# Patient Record
Sex: Female | Born: 1995 | Race: Black or African American | Hispanic: No | Marital: Single | Smoking: Never smoker
Health system: Southern US, Community
[De-identification: ages and names within clinical notes are randomized; demographics above are authoritative.]

## PROBLEM LIST (undated history)

## (undated) DIAGNOSIS — A6000 Herpesviral infection of urogenital system, unspecified: Secondary | ICD-10-CM

## (undated) DIAGNOSIS — J45909 Unspecified asthma, uncomplicated: Secondary | ICD-10-CM

---
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
## 2018-01-10 ENCOUNTER — Other Ambulatory Visit: Payer: Self-pay

## 2018-01-10 ENCOUNTER — Emergency Department (HOSPITAL_COMMUNITY): Payer: 59

## 2018-01-10 ENCOUNTER — Encounter (HOSPITAL_COMMUNITY): Payer: Self-pay | Admitting: Emergency Medicine

## 2018-01-10 ENCOUNTER — Emergency Department (HOSPITAL_COMMUNITY)
Admission: EM | Admit: 2018-01-10 | Discharge: 2018-01-10 | Disposition: A | Discharge status: Home / Self Care | Payer: 59 | Attending: Emergency Medicine | Admitting: Emergency Medicine

## 2018-01-10 DIAGNOSIS — R030 Elevated blood-pressure reading, without diagnosis of hypertension: Secondary | ICD-10-CM | POA: Insufficient documentation

## 2018-01-10 DIAGNOSIS — J45909 Unspecified asthma, uncomplicated: Secondary | ICD-10-CM | POA: Insufficient documentation

## 2018-01-10 DIAGNOSIS — J069 Acute upper respiratory infection, unspecified: Secondary | ICD-10-CM | POA: Diagnosis not present

## 2018-01-10 DIAGNOSIS — N912 Amenorrhea, unspecified: Secondary | ICD-10-CM | POA: Diagnosis not present

## 2018-01-10 DIAGNOSIS — R0981 Nasal congestion: Secondary | ICD-10-CM

## 2018-01-10 LAB — POC URINE PREG, ED: Preg Test, Ur: NEGATIVE

## 2018-01-10 MED ORDER — AMOXICILLIN-POT CLAVULANATE 875-125 MG PO TABS: 1.0000 | ORAL_TABLET | Freq: Two times a day (BID) | ORAL | 0 refills | Status: AC

## 2018-01-10 MED ORDER — CETIRIZINE HCL 10 MG PO TABS: 10.0000 mg | ORAL_TABLET | Freq: Every day | ORAL | 0 refills | Status: DC

## 2018-01-10 MED ORDER — DM-GUAIFENESIN ER 30-600 MG PO TB12: 1.0000 | ORAL_TABLET | Freq: Two times a day (BID) | ORAL | 0 refills | Status: AC

## 2018-01-10 MED ORDER — FLUTICASONE PROPIONATE 50 MCG/ACT NA SUSP: 1.0000 | Freq: Every day | NASAL | 2 refills | Status: DC

## 2018-01-10 NOTE — Discharge Instructions (Signed)
Please read and follow all provided instructions.  Your diagnoses today include:  1. Nasal congestion   2. Upper respiratory tract infection, unspecified type   3. Elevated blood pressure reading without diagnosis of hypertension     You appear to have an upper respiratory infection (URI). An upper respiratory tract infection, or cold, is a viral infection of the air passages leading to the lungs. It should improve gradually after 5-7 days. You may have a lingering cough that lasts for 2- 4 weeks after the infection.  Tests performed today include: Vital signs. See below for your results today.   Medications prescribed:   Take any prescribed medications only as directed. Treatment for your infection is aimed at treating the symptoms. There are no medications, such as antibiotics, that will cure your infection.   Please take Zyrtec, 10 mg daily at night.  Flonase, 1 spray in each nostril daily in the morning.  Please only take antibiotics if you are worsening or not improving with your nasal congestion in the next 3 days. Please take all of your antibiotics until finished.   You may develop abdominal discomfort or nausea from the antibiotic. If this occurs, you may take it with food. Some patients also get diarrhea with antibiotics. You may help offset this with probiotics which you can buy or get in yogurt. Do not eat or take the probiotics until 2 hours after your antibiotic. Some women develop vaginal yeast infections after antibiotics. If you develop unusual vaginal discharge after being on this medication, please see your primary care provider.   Some people develop allergies to antibiotics. Symptoms of antibiotic allergy can be mild and include a flat rash and itching. They can also be more serious and include:  ?Hives - Hives are raised, red patches of skin that are usually very itchy.  ?Lip or tongue swelling  ?Trouble swallowing or breathing  ?Blistering of the skin or  mouth.  If you have any of these serious symptoms, please seek emergency medical care immediately.   Home care instructions:  Follow any educational materials contained in this packet.   Your illness is contagious and can be spread to others, especially during the first 3 or 4 days. It cannot be cured by antibiotics or other medicines. Take basic precautions such as washing your hands often, covering your mouth when you cough or sneeze, and avoiding public places where you could spread your illness to others.   Please continue drinking plenty of fluids.  Use over-the-counter medicines as needed as directed on packaging for symptom relief.  You may also use ibuprofen or tylenol as directed on packaging for pain or fever.  Do not take multiple medicines containing Tylenol or acetaminophen to avoid taking too much of this medication.  Follow-up instructions: Please follow-up with your primary care provider in the next 3 days for further evaluation of your symptoms if you are not feeling better.   Return instructions:  Please return to the Emergency Department if you experience worsening symptoms.  RETURN IMMEDIATELY IF you develop shortness of breath, chest pain, confusion or altered mental status, a new rash, become dizzy, faint, or poorly responsive, or are unable to be cared for at home. Please return if you have persistent vomiting and cannot keep down fluids or develop a fever that is not controlled by tylenol or motrin.   Please return if you have any other emergent concerns.  Additional Information:  To find a primary care or specialty doctor please call  [phone] or [phone] to access "[facility] Find a Doctor Service."  You may also go on the [facility] website at [email or web address]  There are also multiple [facility] practices throughout the [place] that are frequently accepting new patients. You may find a clinic that is close to your  home and contact them.  [facility] - [address] [place] [phone]  [facility] in [place] (also locations in [place] and [place]) - [address] [employer] [place] [phone]  [facility] - [address] [place] [phone]   Your vital signs today were: BP (!) 148/78    Pulse 85    Temp 98.9 F (37.2 C) (Oral)    Resp 16    Ht 5\' 6"  (1.676 m)    Wt 117.9 kg    LMP 10/26/2017    SpO2 100%    BMI 41.97 kg/m  If your blood pressure (BP) was elevated above 135/85 this visit, please have this repeated by your doctor within one month. --------------

## 2018-01-10 NOTE — ED Notes (Signed)
Patient verbalizes understanding of discharge instructions. Opportunity for questioning and answers were provided. Armband removed by staff, pt discharged from ED.  

## 2018-01-10 NOTE — ED Triage Notes (Signed)
Pt states she has a nasal congestion, intermittent sore throat, dry cough for a week and a half. Pt also complains of not having period for two months with some spotting. Took a pregnancy test and it was negative.

## 2018-01-10 NOTE — ED Provider Notes (Signed)
[facility] EMERGENCY DEPARTMENT Provider Note   CSN: [id number] Arrival date & time: 01/10/18  4098     History   Chief Complaint Chief Complaint  Patient presents with  . Sore Throat    HPI Kristin Hardy is a 23 y.o. female.  HPI   Patient is a 19 normal female with a history of asthma, well-controlled normal vomitings, presenting for congestion, rhinorrhea, sore throat, cough.  Patient reports that her symptoms began approximate 1.5 weeks ago.  She reports that her throat is no longer sore, however this is the initial predominant symptom.  She reports that now her greatest complaint is ongoing congestion and rhinorrhea.  She reports a cough that seems to be productive of white sputum.  She is denied any fever, chills, or night sweats.  Patient ports that she did have headaches early on in her symptoms, however she denies any facial pain at present.  Patient denies wheezing.  Patient denies any history of immune compromise status.  Patient denies any home remedies for her symptoms.  Of note, patient reports that she has not had a menstrual period in 2 months.  Reports she took a home pregnancy test that was negative.  Past Medical History:  Diagnosis Date  . Asthma     There are no active problems to display for this patient.   History reviewed. No pertinent surgical history.   OB History   No obstetric history on file.      Home Medications    Prior to Admission medications   Medication Sig Start Date End Date Taking? Authorizing Provider  benzonatate (TESSALON) 100 MG capsule Take 1 capsule (100 mg total) by mouth every 8 (eight) hours. 09/09/14   Elson Areas, PA-C  hydrocortisone 2.5 % lotion Apply topically 2 (two) times daily. 11/25/14   Muthersbaugh, Dahlia Client, PA-C  ondansetron (ZOFRAN ODT) 4 MG disintegrating tablet Take 1 tablet (4 mg total) by mouth every 8 (eight) hours as needed for nausea or vomiting. 09/09/14   Elson Areas, PA-C    permethrin (ELIMITE) 5 % cream Apply to entire body other than face - let sit for 14 hours then wash off, may repeat in 1 week if still having symptoms 11/25/14   Muthersbaugh, Boyd Kerbs    Family History History reviewed. No pertinent family history.  Social History Social History   Tobacco Use  . Smoking status: Never Smoker  . Smokeless tobacco: Never Used  Substance Use Topics  . Alcohol use: No  . Drug use: Never     Allergies   Patient has no known allergies.   Review of Systems Review of Systems  Constitutional: Negative for chills and fever.  HENT: Positive for congestion and rhinorrhea. Negative for sinus pressure and sore throat.   Eyes: Negative for redness.  Respiratory: Positive for cough. Negative for shortness of breath.   Gastrointestinal: Negative for abdominal pain, nausea and vomiting.     Physical Exam Updated Vital Signs BP (!) 148/78   Pulse 85   Temp 98.9 F (37.2 C) (Oral)   Resp 16   Ht 5\' 6"  (1.676 m)   Wt 117.9 kg   LMP 10/26/2017   SpO2 100%   BMI 41.97 kg/m   Physical Exam Vitals signs and nursing note reviewed.  Constitutional:      General: She is not in acute distress.    Appearance: She is well-developed. She is not diaphoretic.     Comments: Sitting comfortably in bed.  HENT:     Head: Normocephalic and atraumatic.     Right Ear: Tympanic membrane normal.     Left Ear: Tympanic membrane normal.     Nose: Congestion present.     Comments: +Erythema of inferior turbinates .    Mouth/Throat:     Mouth: Mucous membranes are moist.     Pharynx: No pharyngeal swelling.     Tonsils: Swelling: 2+ on the right. 2+ on the left.  Eyes:     General:        Right eye: No discharge.        Left eye: No discharge.     Conjunctiva/sclera: Conjunctivae normal.     Comments: EOMs normal to gross examination.  Neck:     Musculoskeletal: Normal range of motion.  Cardiovascular:     Rate and Rhythm: Normal rate and regular  rhythm.     Heart sounds: No murmur.  Pulmonary:     Effort: Pulmonary effort is normal.     Breath sounds: Normal breath sounds. No wheezing, rhonchi or rales.  Abdominal:     General: There is no distension.  Musculoskeletal: Normal range of motion.  Skin:    General: Skin is warm and dry.  Neurological:     Mental Status: She is alert.     Comments: Cranial nerves intact to gross observation. Patient moves extremities without difficulty.  Psychiatric:        Behavior: Behavior normal.        Thought Content: Thought content normal.        Judgment: Judgment normal.      ED Treatments / Results  Labs (all labs ordered are listed, but only abnormal results are displayed) Labs Reviewed  POC URINE PREG, ED  POC URINE PREG, ED    EKG None  Radiology Dg Chest 2 View  Result Date: 01/10/2018 CLINICAL DATA:  Productive cough and congestion 1 week. EXAM: CHEST - 2 VIEW COMPARISON:  None. FINDINGS: Lungs are adequately inflated and otherwise clear. Cardiomediastinal silhouette is within normal. Bones and soft tissues are normal. IMPRESSION: No active cardiopulmonary disease. Electronically Signed   By: Elberta Fortisaniel  Boyle M.D.   On: 01/10/2018 10:57    Procedures Procedures (including critical care time)  Medications Ordered in ED Medications - No data to display   Initial Impression / Assessment and Plan / ED Course  I have reviewed the triage vital signs and the nursing notes.  Pertinent labs & imaging results that were available during my care of the patient were reviewed by me and considered in my medical decision making (see chart for details).     Patient is well-appearing, afebrile, and in no acute distress.  Differential diagnosis includes acute sinusitis, allergic rhinitis, upper respiratory infection, viral sinusitis.  Given the 1.5-week time period of symptoms, did discuss with patient that bacterial superinfection of the sinuses is possible, however she does like  some hallmark symptoms at this time such as facial pain, fever.  Given cobblestoning posterior pharynx, and the predominance of congestion rhinorrhea, I discussed with the patient that she may have an element of allergic rhinitis giving her new environment (pt moved here within last 6 months).  Recommended Zyrtec and Flonase the patient, however discussed that will prescribe antibiotic prescription where she is not improving in the next 3 days, develops facial pain or fever, to please initiate the medication.  Return precautions given for any high fevers, shortness of breath, increasing productive cough or new or worsening  symptoms.  Patient is in understanding and agrees with plan of care.  Regarding patient's amenorrhea, I discussed with patient there are many causes. Pregnancy test is negative today.  Final Clinical Impressions(s) / ED Diagnoses   Final diagnoses:  Nasal congestion  Upper respiratory tract infection, unspecified type  Elevated blood pressure reading without diagnosis of hypertension    ED Discharge Orders         Ordered    amoxicillin-clavulanate (AUGMENTIN) 875-125 MG tablet  Every 12 hours     01/10/18 1158    cetirizine (ZYRTEC) 10 MG tablet  Daily     01/10/18 1200    fluticasone (FLONASE) 50 MCG/ACT nasal spray  Daily     01/10/18 1200    dextromethorphan-guaiFENesin (MUCINEX DM) 30-600 MG 12hr tablet  2 times daily     01/10/18 1200           Elisha PonderMurray,  B, [place]PA-C 01/10/18 1201    Tegeler, Canary Brimhristopher J, MD 01/10/18 1521

## 2018-02-10 ENCOUNTER — Other Ambulatory Visit: Payer: Self-pay

## 2018-02-10 ENCOUNTER — Emergency Department (HOSPITAL_COMMUNITY)
Admission: EM | Admit: 2018-02-10 | Discharge: 2018-02-10 | Disposition: A | Discharge status: Home / Self Care | Payer: 59 | Attending: Emergency Medicine | Admitting: Emergency Medicine

## 2018-02-10 ENCOUNTER — Encounter (HOSPITAL_COMMUNITY): Payer: Self-pay | Admitting: Emergency Medicine

## 2018-02-10 DIAGNOSIS — N939 Abnormal uterine and vaginal bleeding, unspecified: Secondary | ICD-10-CM | POA: Insufficient documentation

## 2018-02-10 DIAGNOSIS — J45909 Unspecified asthma, uncomplicated: Secondary | ICD-10-CM | POA: Insufficient documentation

## 2018-02-10 LAB — BASIC METABOLIC PANEL
ANION GAP: 6 (ref 5–15)
BUN: 8 mg/dL (ref 6–20)
CALCIUM: 8.6 mg/dL — AB (ref 8.9–10.3)
CHLORIDE: 105 mmol/L (ref 98–111)
CO2: 25 mmol/L (ref 22–32)
Creatinine, Ser: 0.74 mg/dL (ref 0.44–1.00)
GFR calc Af Amer: >60 mL/min (ref >60)
GFR calc non Af Amer: >60 mL/min (ref >60)
GLUCOSE: 100 mg/dL — AB (ref 70–99)
POTASSIUM: 4.3 mmol/L (ref 3.5–5.1)
Sodium: 136 mmol/L (ref 135–145)

## 2018-02-10 LAB — WET PREP, GENITAL
Clue Cells Wet Prep HPF POC: NONE SEEN
SPERM: NONE SEEN
Trich, Wet Prep: NONE SEEN
WBC, Wet Prep HPF POC: NONE SEEN
Yeast Wet Prep HPF POC: NONE SEEN

## 2018-02-10 LAB — ABO/RH: ABO/RH(D): A POS

## 2018-02-10 LAB — I-STAT BETA HCG BLOOD, ED (MC, WL, AP ONLY)

## 2018-02-10 LAB — CBC
HEMATOCRIT: 42.9 % (ref 36.0–46.0)
HEMOGLOBIN: 12.3 g/dL (ref 12.0–15.0)
MCH: 23.9 pg — AB (ref 26.0–34.0)
MCHC: 28.7 g/dL — ABNORMAL LOW (ref 30.0–36.0)
MCV: 83.3 fL (ref 80.0–100.0)
NRBC: 0 % (ref 0.0–0.2)
Platelets: 477 10*3/uL — ABNORMAL HIGH (ref 150–400)
RBC: 5.15 MIL/uL — AB (ref 3.87–5.11)
RDW: 15.2 % (ref 11.5–15.5)
WBC: 16 10*3/uL — ABNORMAL HIGH (ref 4.0–10.5)

## 2018-02-10 MED ORDER — IBUPROFEN 800 MG PO TABS: 800.0000 mg | ORAL_TABLET | Freq: Three times a day (TID) | ORAL | 0 refills | Status: AC | PRN

## 2018-02-10 NOTE — ED Provider Notes (Signed)
[facility]-EMERGENCY DEPT Provider Note   CSN: [id number] Arrival date & time: 02/10/18  1812     History   Chief Complaint Chief Complaint  Patient presents with  . Vaginal Bleeding    HPI Kristin Hardy is a 23 y.o. female.  HPI   23 yo F with hx of asthma, otherwise healthy presents to the ED for evaluation of vaginal bleeding. Pt reports that she has had heavy vaginal bleeding for th epast 10 days, with some small clots. She reports chnaging her pad every few hours. Reports prior to this episode of bleeding she did not have a period for 3 months, but before that she had regular monthly cycles last 5-7 days with moderate bleeding. Pt is not currently on any contraceptives and reports she is actively trying to conceive, but has not had any positive hoem pregnancy tests at home. She deneis any abdominal pain associated with bleeding, has some occasional very mild lower abdominal cramping. No vaginal discharge. No fevers or chills, no nausea or vomiting. No chest pain, palpitations or shortness fo breath, no fatigue, lightheadedness, weakness or syncope. Denie ever requiring a transfusion due to anemia. Does have a family history of uterine fibroids.    Past Medical History:  Diagnosis Date  . Asthma     There are no active problems to display for this patient.   History reviewed. No pertinent surgical history.   OB History   No obstetric history on file.      Home Medications    Prior to Admission medications   Medication Sig Start Date End Date Taking? Authorizing Provider  fluticasone (FLONASE) 50 MCG/ACT nasal spray Place 1 spray into both nostrils daily. Patient not taking: Reported on 02/10/2018 01/10/18   Aviva Kluver B, PA-C  hydrocortisone 2.5 % lotion Apply topically 2 (two) times daily. Patient not taking: Reported on 02/10/2018 11/25/14   Muthersbaugh, Dahlia Client, PA-C  ibuprofen (ADVIL,MOTRIN) 800 MG tablet Take 1 tablet (800 mg total) by mouth  every 8 (eight) hours as needed for up to 14 days. 02/10/18 02/24/18  Dartha Lodge, PA-C  permethrin (ELIMITE) 5 % cream Apply to entire body other than face - let sit for 14 hours then wash off, may repeat in 1 week if still having symptoms Patient not taking: Reported on 02/10/2018 11/25/14   Muthersbaugh, Dahlia Client, PA-C    Family History No family history on file.  Social History Social History   Tobacco Use  . Smoking status: Never Smoker  . Smokeless tobacco: Never Used  Substance Use Topics  . Alcohol use: No  . Drug use: Never     Allergies   Patient has no known allergies.   Review of Systems Review of Systems  Constitutional: Negative for chills, fatigue and fever.  HENT: Negative.   Respiratory: Negative for chest tightness and shortness of breath.   Cardiovascular: Negative for chest pain and palpitations.  Gastrointestinal: Negative for abdominal pain, nausea and vomiting.  Genitourinary: Positive for menstrual problem and vaginal bleeding. Negative for dysuria, frequency, hematuria, pelvic pain, vaginal discharge and vaginal pain.  Musculoskeletal: Negative for arthralgias and myalgias.  Skin: Negative for color change and rash.  Neurological: Negative for dizziness, syncope and light-headedness.     Physical Exam Updated Vital Signs BP (!) 121/102 (BP Location: Left Arm)   Pulse 66   Temp 98.6 F (37 C) (Oral)   Resp 20   LMP 02/10/2018   SpO2 100%   Physical Exam Vitals signs  and nursing note reviewed. Exam conducted with a chaperone present.  Constitutional:      General: She is not in acute distress.    Appearance: Normal appearance. She is well-developed. She is not ill-appearing or diaphoretic.  HENT:     Head: Normocephalic and atraumatic.     Mouth/Throat:     Mouth: Mucous membranes are moist.     Pharynx: Oropharynx is clear.  Eyes:     General:        Right eye: No discharge.        Left eye: No discharge.     Pupils: Pupils are equal,  round, and reactive to light.  Neck:     Musculoskeletal: Neck supple.  Cardiovascular:     Rate and Rhythm: Normal rate and regular rhythm.     Pulses: Normal pulses.     Heart sounds: Normal heart sounds. No murmur. No friction rub. No gallop.   Pulmonary:     Effort: Pulmonary effort is normal. No respiratory distress.     Breath sounds: Normal breath sounds. No wheezing or rales.     Comments: Respirations equal and unlabored, patient able to speak in full sentences, lungs clear to auscultation bilaterally Abdominal:     General: Abdomen is flat. Bowel sounds are normal. There is no distension.     Palpations: Abdomen is soft. There is no mass.     Tenderness: There is no abdominal tenderness. There is no guarding.     Comments: Abdomen soft, nondistended, nontender to palpation in all quadrants without guarding or peritoneal signs  Genitourinary:    Comments: Chaperone present during exam Speculum exam reveals a small amount of dark blood without clots pooled in the vaginal vault, there is no active or brisk bleeding from the cervical os, no discharge noted. On bimanual exam no palpable masses over uterus, no adnexal masses or tenderness Musculoskeletal:        General: No deformity.  Skin:    General: Skin is warm and dry.     Capillary Refill: Capillary refill takes less than 2 seconds.  Neurological:     Mental Status: She is alert and oriented to person, place, and time.     Coordination: Coordination normal.  Psychiatric:        Mood and Affect: Mood normal.        Behavior: Behavior normal.      ED Treatments / Results  Labs (all labs ordered are listed, but only abnormal results are displayed) Labs Reviewed  CBC - Abnormal; Notable for the following components:      Result Value   WBC 16.0 (*)    RBC 5.15 (*)    MCH 23.9 (*)    MCHC 28.7 (*)    Platelets 477 (*)    All other components within normal limits  BASIC METABOLIC PANEL - Abnormal; Notable for the  following components:   Glucose, Bld 100 (*)    Calcium 8.6 (*)    All other components within normal limits  WET PREP, GENITAL  I-STAT BETA HCG BLOOD, ED ([facility], [facility], [facility] ONLY)  ABO/RH  GC/CHLAMYDIA PROBE AMP () NOT AT [facility]    EKG None  Radiology No results found.  Procedures Procedures (including critical care time)  Medications Ordered in ED Medications - No data to display   Initial Impression / Assessment and Plan / ED Course  I have reviewed the triage vital signs and the nursing notes.  Pertinent labs & imaging  results that were available during my care of the patient were reviewed by me and considered in my medical decision making (see chart for details).  23 yo F presents w/ 10 days of vaginal leeding, negative pregnancy no associated abdominal pain and abdomen benign on exam, pelvic exam with small amount of blood in the vaginal  vault, but no active or brisk bleeding from the os. Pt is hemodynamically stable and does not appear to be significantly symptomatic from blood loss. WIll check basic labs, and pt rewuiested STD testing.  Labs very reassuring, normal hgb of 12.3, pt does have leukocytosis of 16.0, but denies any acute infectious symptoms, is afebrile and well appearing, discussed retuen precautions but do not feel this warrants furhter emergent workup. No electrolyte derrangements. Wet prep w/o signs of infection.  Discussed reassuring labs with patient, will have her start taking ibuprofen regularly to help with bleeding and will have pt follow up with GYN. Return precautions discussed, pt expresses understanding and agreement with plan.  Final Clinical Impressions(s) / ED Diagnoses   Final diagnoses:  Vaginal bleeding    ED Discharge Orders         Ordered    ibuprofen (ADVIL,MOTRIN) 800 MG tablet  Every 8 hours PRN     02/10/18 2233           Dartha LodgeFord, Carma Dwiggins N, PA-C 02/13/18 1423    Virgina Norfolkuratolo, Adam, DO 02/16/18 1500

## 2018-02-10 NOTE — ED Triage Notes (Signed)
Patient c/o heavy vaginal bleeding x10 days after not having a menstrual cycle for three months. Reports approximately one pad per hour. Reports intermittent abdominal cramping.

## 2018-02-10 NOTE — ED Notes (Signed)
Pelvic supplies at bedside. 

## 2018-02-10 NOTE — Discharge Instructions (Signed)
Your hemoglobin is stable today, please follow-up with your OB/GYN for further evaluation of vaginal bleeding, and begin taking ibuprofen 3 times daily as prescribed this will help with bleeding and cramps.  Return to the emergency department if you have significantly increased bleeding, you develop lightheadedness, dizziness, fatigue, chest pain, shortness of breath or palpitations or any other new or concerning symptoms.

## 2018-02-11 LAB — GC/CHLAMYDIA PROBE AMP (~~LOC~~) NOT AT ARMC
CHLAMYDIA, DNA PROBE: NEGATIVE
Neisseria Gonorrhea: NEGATIVE

## 2018-05-31 ENCOUNTER — Other Ambulatory Visit: Payer: Self-pay

## 2018-05-31 ENCOUNTER — Encounter (HOSPITAL_COMMUNITY): Payer: Self-pay | Admitting: Emergency Medicine

## 2018-05-31 ENCOUNTER — Emergency Department (HOSPITAL_COMMUNITY)
Admission: EM | Admit: 2018-05-31 | Discharge: 2018-05-31 | Disposition: A | Discharge status: Home / Self Care | Payer: 59 | Attending: Emergency Medicine | Admitting: Emergency Medicine

## 2018-05-31 DIAGNOSIS — J45909 Unspecified asthma, uncomplicated: Secondary | ICD-10-CM | POA: Diagnosis not present

## 2018-05-31 DIAGNOSIS — A5901 Trichomonal vulvovaginitis: Secondary | ICD-10-CM | POA: Diagnosis not present

## 2018-05-31 DIAGNOSIS — A609 Anogenital herpesviral infection, unspecified: Secondary | ICD-10-CM | POA: Insufficient documentation

## 2018-05-31 DIAGNOSIS — L539 Erythematous condition, unspecified: Secondary | ICD-10-CM | POA: Diagnosis not present

## 2018-05-31 DIAGNOSIS — N938 Other specified abnormal uterine and vaginal bleeding: Secondary | ICD-10-CM | POA: Insufficient documentation

## 2018-05-31 DIAGNOSIS — N898 Other specified noninflammatory disorders of vagina: Secondary | ICD-10-CM | POA: Diagnosis present

## 2018-05-31 HISTORY — DX: Herpesviral infection of urogenital system, unspecified: A60.00

## 2018-05-31 LAB — WET PREP, GENITAL
Clue Cells Wet Prep HPF POC: NONE SEEN
Sperm: NONE SEEN
Yeast Wet Prep HPF POC: NONE SEEN

## 2018-05-31 LAB — URINALYSIS, ROUTINE W REFLEX MICROSCOPIC
Bacteria, UA: NONE SEEN
Bilirubin Urine: NEGATIVE
Glucose, UA: NEGATIVE mg/dL
Ketones, ur: NEGATIVE mg/dL
Nitrite: NEGATIVE
Protein, ur: NEGATIVE mg/dL
Specific Gravity, Urine: 1.017 (ref 1.005–1.030)
pH: 6 (ref 5.0–8.0)

## 2018-05-31 LAB — POC URINE PREG, ED: Preg Test, Ur: NEGATIVE

## 2018-05-31 MED ORDER — METRONIDAZOLE 500 MG PO TABS: 500.0000 mg | ORAL_TABLET | Freq: Two times a day (BID) | ORAL | 0 refills | Status: AC

## 2018-05-31 NOTE — Discharge Instructions (Addendum)
Please read the instructions below.  Talk with your primary care provider about any new medications.  Please schedule an appointment for follow up with your OBGYN or primary care.  Finish your antibiotic (Flagyl/Metronidazole) as prescribed. Do not drink alcohol with this medication as it will cause vomiting.  You will receive a call from the hospital if your test results come back positive. Avoid all sexual activity until you know your test results. If your results come back positive, it is important that you inform all of your sexual partners.  Return to the ER for high fever, severe abdominal pain, or new or worsening symptoms.   

## 2018-05-31 NOTE — ED Notes (Signed)
ED Provider at bedside. 

## 2018-05-31 NOTE — ED Provider Notes (Signed)
[facility] Provider Note   CSN: [id number] Arrival date & time: 05/31/18  1009    History   Chief Complaint Chief Complaint  Patient presents with  . Vaginal Itching    HPI Kristin Hardy is a 23 y.o. female past medical history of genital herpes, asthma, presenting to the emergency department complaint of vaginal itching for 2 weeks.  She states the itching comes and goes.  She has no associated vaginal pain, rashes or lesions.  Of note, she was tested positive for genital herpes recently on 03/16/2018. She states her herpes outbreak was much more painful. She is currently sexually active with a female partner without protection. No abnormal discharge or bleeding, though she is due to start her menstrual period today. She denies urinary sx, vaginal pain, rashes/lesions, abd pain, fever. She is pre-diabetic. No interventions tried PTA. No recent abx.     The history is provided by the patient and medical records.    Past Medical History:  Diagnosis Date  . Asthma   . Genital herpes     There are no active problems to display for this patient.   History reviewed. No pertinent surgical history.   OB History   No obstetric history on file.      Home Medications    Prior to Admission medications   Medication Sig Start Date End Date Taking? Authorizing Provider  metroNIDAZOLE (FLAGYL) 500 MG tablet Take 1 tablet (500 mg total) by mouth 2 (two) times daily. 05/31/18   Robinson, [place] N, PA-C    Family History History reviewed. No pertinent family history.  Social History Social History   Tobacco Use  . Smoking status: Never Smoker  . Smokeless tobacco: Never Used  Substance Use Topics  . Alcohol use: No  . Drug use: Never     Allergies   Patient has no known allergies.   Review of Systems Review of Systems  Constitutional: Negative for fever.  Gastrointestinal: Negative for abdominal pain.  Genitourinary: Negative for  dysuria, frequency, pelvic pain, vaginal bleeding, vaginal discharge and vaginal pain.  All other systems reviewed and are negative.    Physical Exam Updated Vital Signs BP 109/86 (BP Location: Right Arm)   Pulse 79   Temp 98.5 F (36.9 C) (Oral)   Resp 14   Ht 5\' 6"  (1.676 m)   Wt 117.9 kg   LMP 04/28/2018   SpO2 100%   BMI 41.97 kg/m   Physical Exam Vitals signs and nursing note reviewed. Exam conducted with a chaperone present (female NT chaperone).  Constitutional:      General: She is not in acute distress.    Appearance: She is well-developed.     Comments: Morbidly obese  HENT:     Head: Normocephalic and atraumatic.  Eyes:     Conjunctiva/sclera: Conjunctivae normal.  Cardiovascular:     Rate and Rhythm: Normal rate.  Pulmonary:     Effort: Pulmonary effort is normal.  Abdominal:     General: Bowel sounds are normal.     Palpations: Abdomen is soft.     Tenderness: There is no abdominal tenderness. There is no guarding.  Genitourinary:    General: Normal vulva.     Pubic Area: No rash.      Labia:        Right: No rash, tenderness or lesion.        Left: No rash, tenderness or lesion.      Vagina: Erythema present.  No vaginal discharge or tenderness.     Cervix: No cervical motion tenderness.     Uterus: Not tender.      Adnexa:        Right: No tenderness.         Left: No tenderness.       Comments: Small amount of dark red blood from the cervical os. Skin:    General: Skin is warm.  Neurological:     Mental Status: She is alert.  Psychiatric:        Behavior: Behavior normal.      ED Treatments / Results  Labs (all labs ordered are listed, but only abnormal results are displayed) Labs Reviewed  WET PREP, GENITAL - Abnormal; Notable for the following components:      Result Value   Trich, Wet Prep PRESENT (*)    WBC, Wet Prep HPF POC FEW (*)    All other components within normal limits  URINALYSIS, ROUTINE W REFLEX MICROSCOPIC - Abnormal;  Notable for the following components:   APPearance HAZY (*)    Hgb urine dipstick LARGE (*)    Leukocytes,Ua MODERATE (*)    All other components within normal limits  URINE CULTURE  POC URINE PREG, ED  GC/CHLAMYDIA PROBE AMP () NOT AT [facility]    EKG None  Radiology No results found.  Procedures Procedures (including critical care time)  Medications Ordered in ED Medications - No data to display   Initial Impression / Assessment and Plan / ED Course  I have reviewed the triage vital signs and the nursing notes.  Pertinent labs & imaging results that were available during my care of the patient were reviewed by me and considered in my medical decision making (see chart for details).        Patient diagnosed with trichomonas via wet prep with chief complaint of vaginal itching x2 weeks.  Patient without abdominal complaints or systemic symptoms.  Pelvic exam without CMT or adnexal tenderness, doubt PID.  No urinary symptoms.  Patient is just beginning her menstrual period this morning.  UA appears to be contaminated specimen.  Culture sent.  Patient declined prophylactic treatment for gonorrhea and chlamydia today.  Patient also declined HIV and RPR testing.  GC chlamydia cultures are sent and pending.  Patient prescribed Flagyl for trichomonas.  Instructed patient to avoid alcohol while taking this medication.  She is aware she has STD tests and is instructed to avoid any intercourse until she knows all of her results.  She is instructed to form all of her sexual partners of her diagnosis today so they can be treated for trichomonas.  Patient verbalized understanding and agrees with care plan.  Patient discharged.   Discussed results, findings, treatment and follow up. Patient advised of return precautions. Patient verbalized understanding and agreed with plan.   Final Clinical Impressions(s) / ED Diagnoses   Final diagnoses:  Trichomonas vaginitis    ED Discharge  Orders         Ordered    metroNIDAZOLE (FLAGYL) 500 MG tablet  2 times daily     05/31/18 1200           Robinson, [place] N, [place] 05/31/18 1306    Maia Plan, MD 06/01/18 [phone]

## 2018-05-31 NOTE — ED Triage Notes (Signed)
Patient presents with complaints of vaginal itching and one episode of presence of yeast. Last pap smear one month ago. Denies burning and odor.

## 2018-06-01 LAB — URINE CULTURE: Culture: <10000 — AB

## 2019-06-01 IMAGING — DX DG CHEST 2V
2 series · 2 of 2 positions shown · non-contrast
Comparison: None.

CLINICAL DATA: Productive cough and congestion 1 week.

EXAM:
CHEST - 2 VIEW

[chest pa]
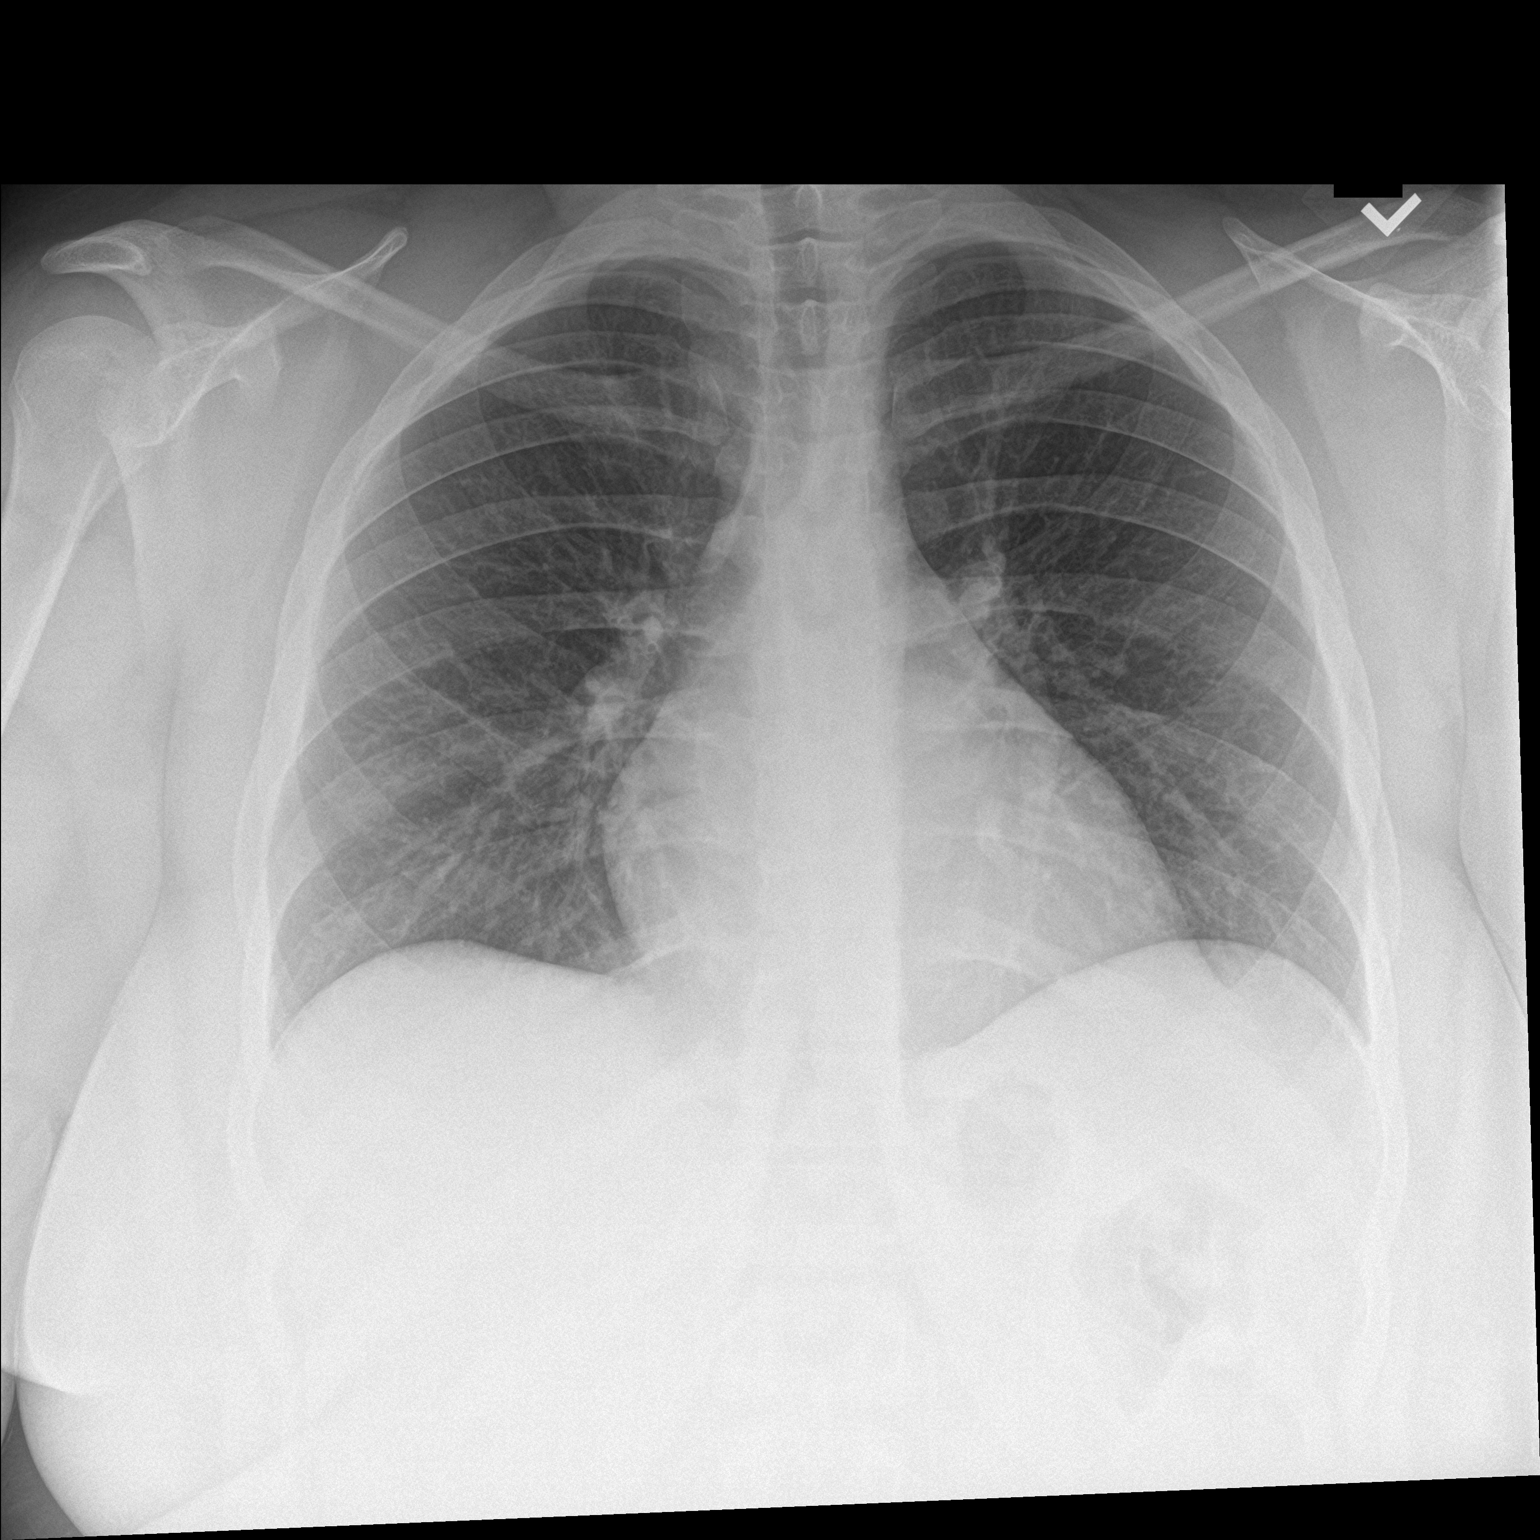

[chest lat]
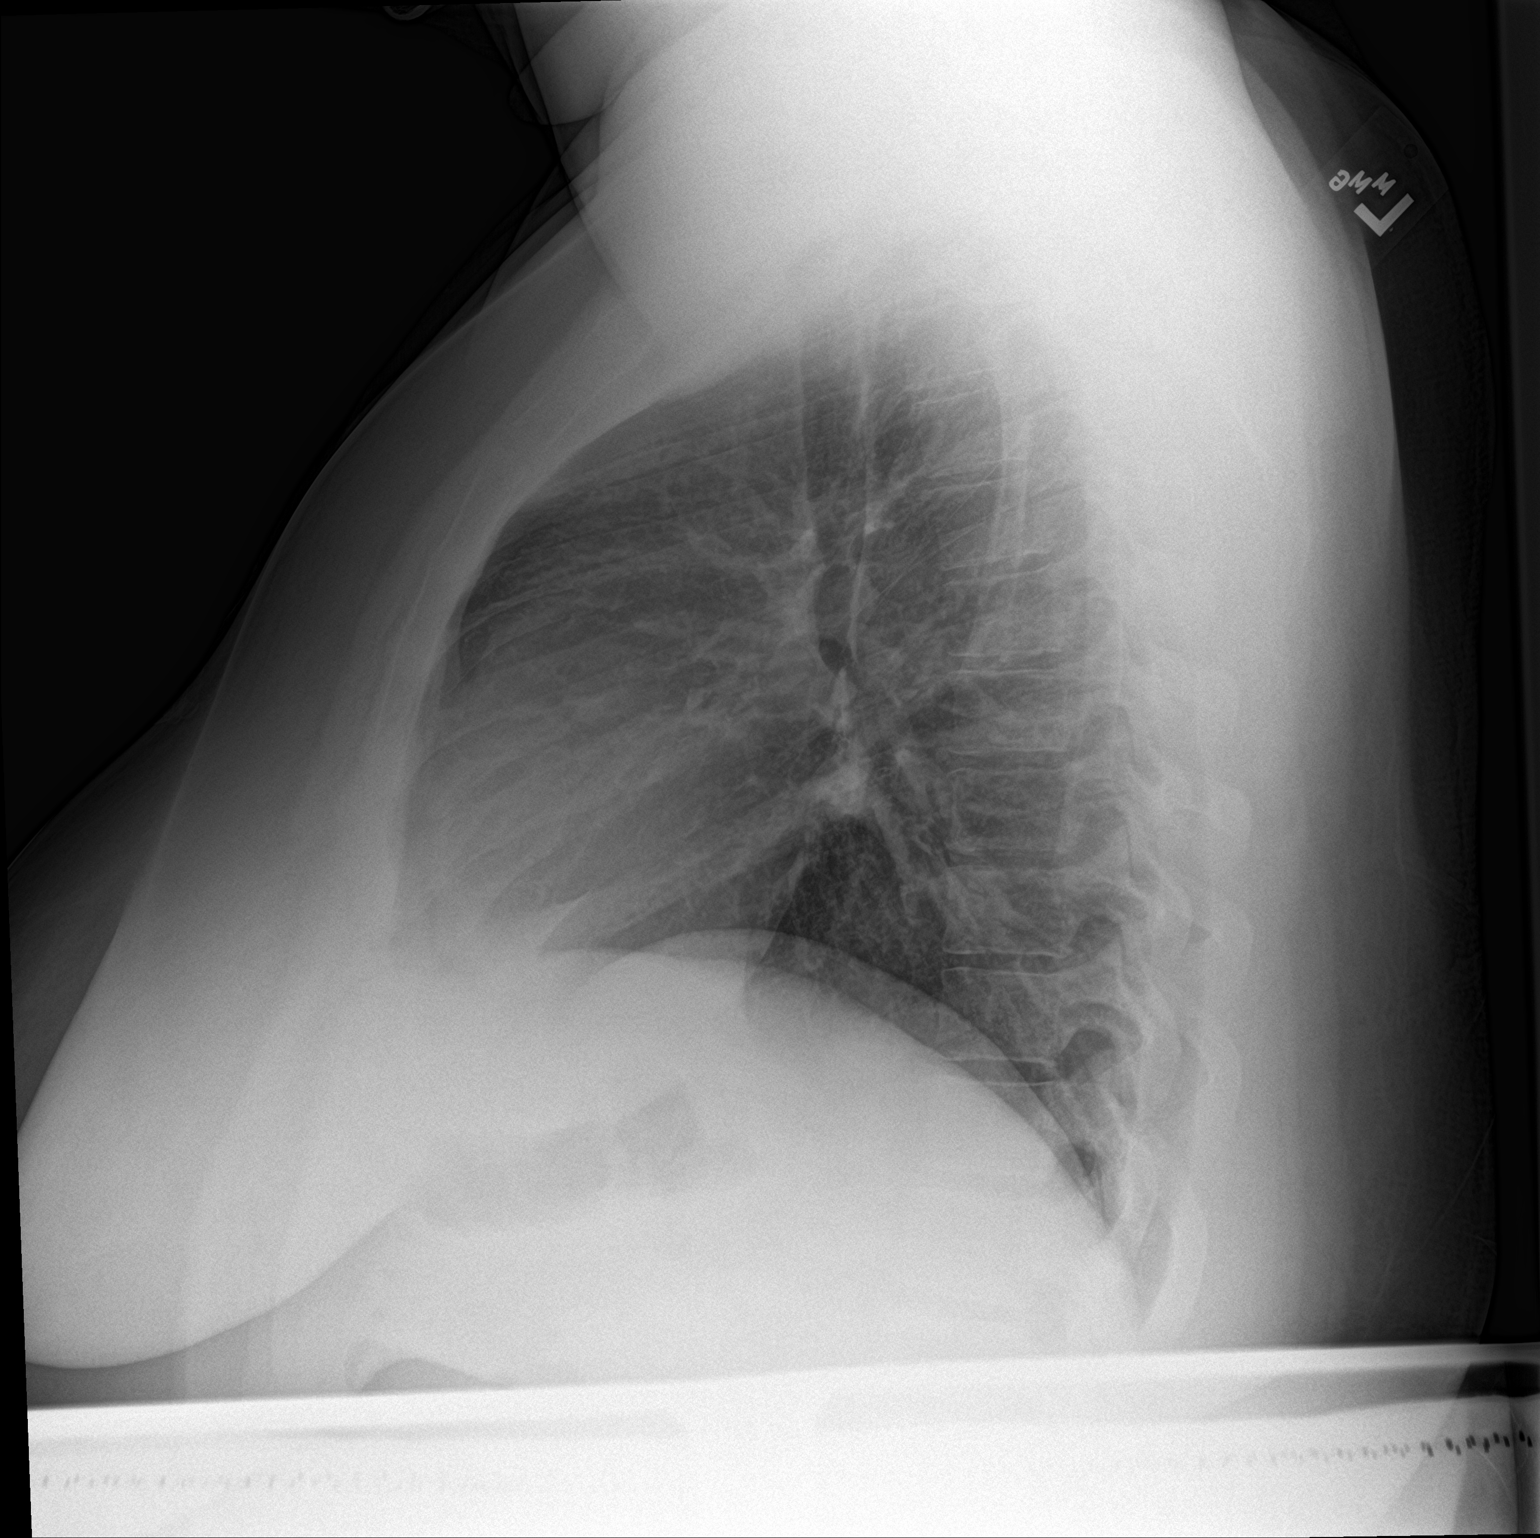

[2 of 2 positions shown; findings below may reference images not displayed]

FINDINGS: Lungs are adequately inflated and otherwise clear. Cardiomediastinal
silhouette is within normal. Bones and soft tissues are normal.
IMPRESSION: No active cardiopulmonary disease.
